# Patient Record
Sex: Female | Born: 1960 | Race: White | Hispanic: No
Health system: Southern US, Community
[De-identification: ages and names within clinical notes are randomized; demographics above are authoritative.]

---

## 2001-04-16 ENCOUNTER — Ambulatory Visit (HOSPITAL_COMMUNITY): Admission: RE | Admit: 2001-04-16 | Discharge: 2001-04-16 | Payer: Self-pay | Admitting: Specialist

## 2001-04-16 ENCOUNTER — Encounter: Payer: Self-pay | Admitting: Specialist

## 2001-05-24 ENCOUNTER — Other Ambulatory Visit: Admission: RE | Admit: 2001-05-24 | Discharge: 2001-05-24 | Payer: Self-pay | Admitting: Obstetrics and Gynecology

## 2002-07-12 ENCOUNTER — Ambulatory Visit (HOSPITAL_COMMUNITY): Admission: RE | Admit: 2002-07-12 | Discharge: 2002-07-12 | Payer: Self-pay | Admitting: Obstetrics and Gynecology

## 2002-07-12 ENCOUNTER — Encounter: Payer: Self-pay | Admitting: Obstetrics and Gynecology

## 2003-09-03 ENCOUNTER — Ambulatory Visit (HOSPITAL_COMMUNITY): Admission: RE | Admit: 2003-09-03 | Discharge: 2003-09-03 | Payer: Self-pay | Admitting: Specialist

## 2003-10-08 ENCOUNTER — Emergency Department (HOSPITAL_COMMUNITY): Admission: EM | Admit: 2003-10-08 | Discharge: 2003-10-09 | Payer: Self-pay | Admitting: *Deleted

## 2003-10-09 ENCOUNTER — Inpatient Hospital Stay (HOSPITAL_COMMUNITY): Admission: EM | Admit: 2003-10-09 | Discharge: 2003-10-13 | Payer: Self-pay | Admitting: Psychiatry

## 2003-11-18 ENCOUNTER — Inpatient Hospital Stay (HOSPITAL_COMMUNITY): Admission: RE | Admit: 2003-11-18 | Discharge: 2003-11-22 | Payer: Self-pay | Admitting: Psychiatry

## 2004-09-12 IMAGING — CT CT HEAD W/O CM
1 series · 16 of 28 positions shown, 20 images · non-contrast
Comparison: none

CLINICAL DATA: Drug overdose.
 CT HEAD WITHOUT CONTRAST 
 No prior studies for comparison.

  Routine non-contrast head CT was performed. 
 There is no evidence of intracranial hemorrhage, brain edema, or mass effect. The ventricles are normal. No extra-axial abnormalities are identified. Bone windows show no significant abnormalities.
 IMPRESSION
 Negative non-contrast head CT.

[Series 8155: — · axial · 0.45mm/px · z∈[-655,-530]mm · 16 of 28 slices shown, 20 images]
[im 2/28  brain]
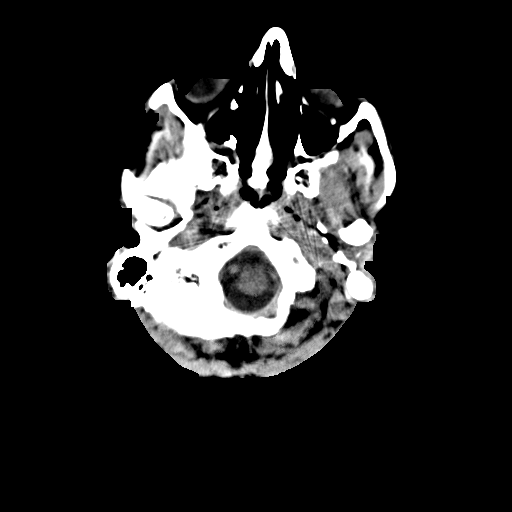
[im 2/28  bone]
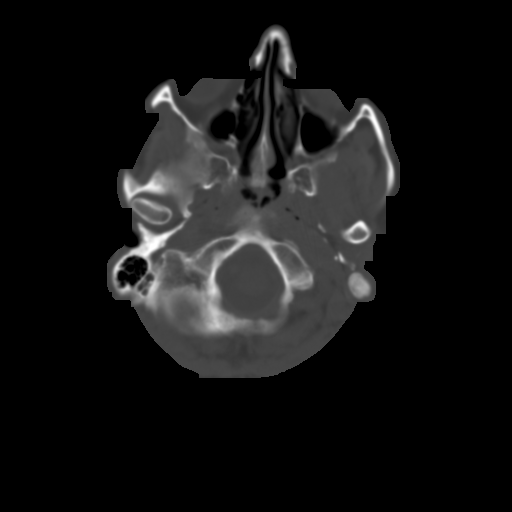
[im 4/28  brain]
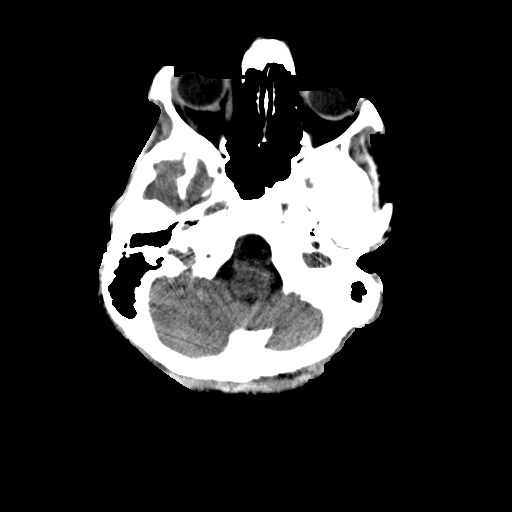
[im 6/28  brain]
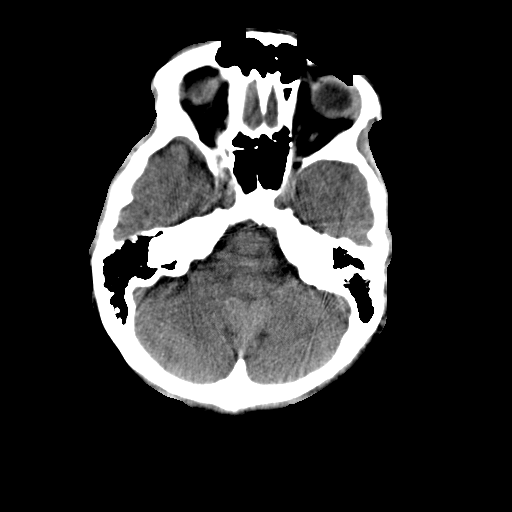
[im 7/28  brain]
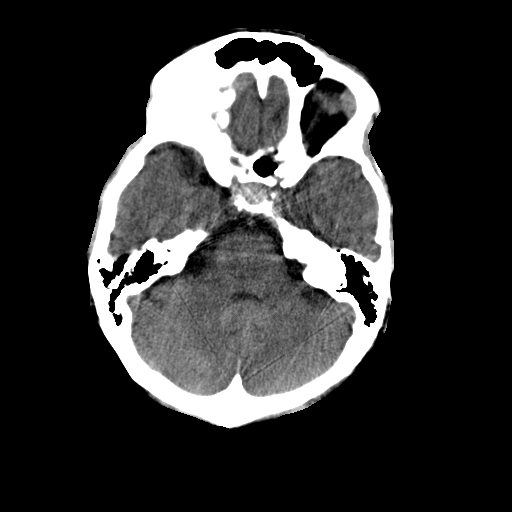
[im 9/28  brain]
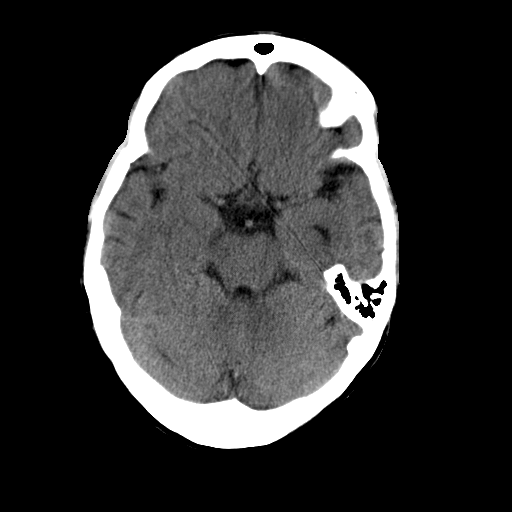
[im 9/28  bone]
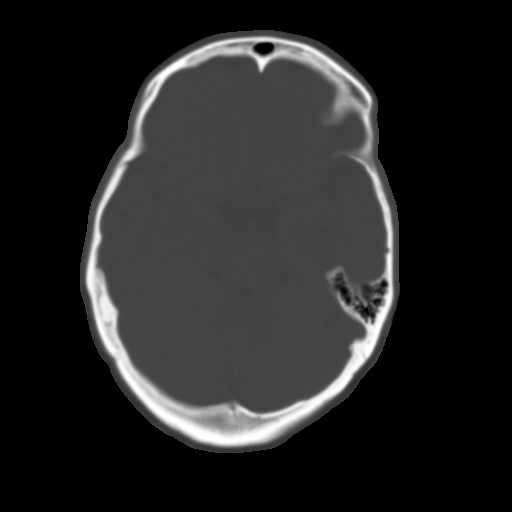
[im 10/28  brain]
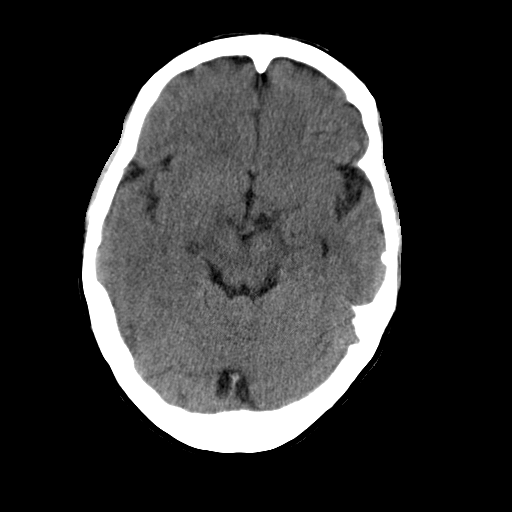
[im 12/28  brain]
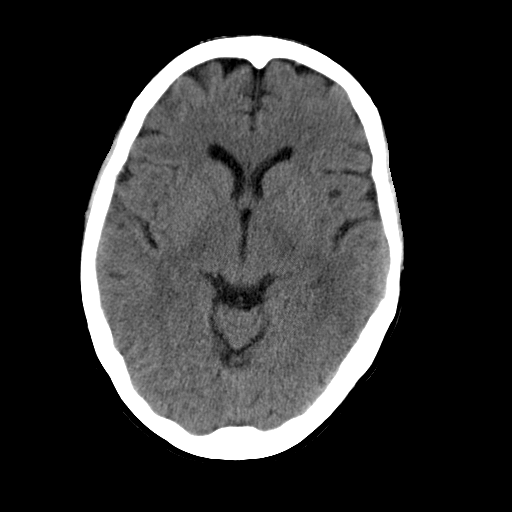
[im 14/28  brain]
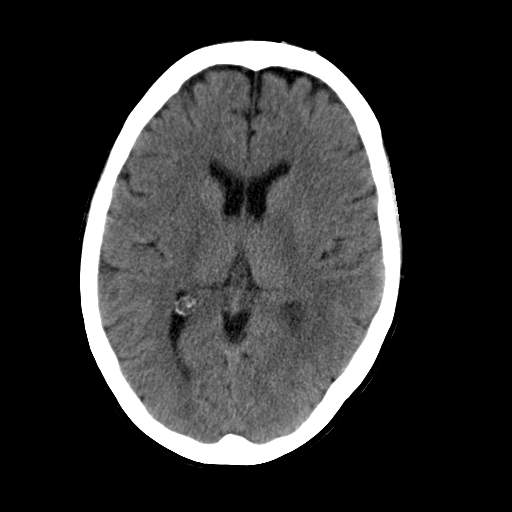
[im 15/28  brain]
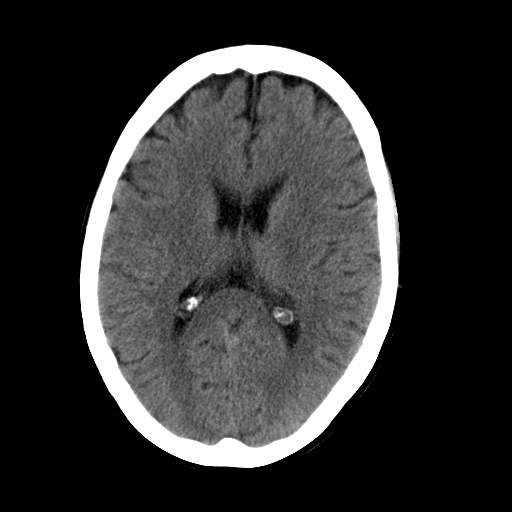
[im 15/28  bone]
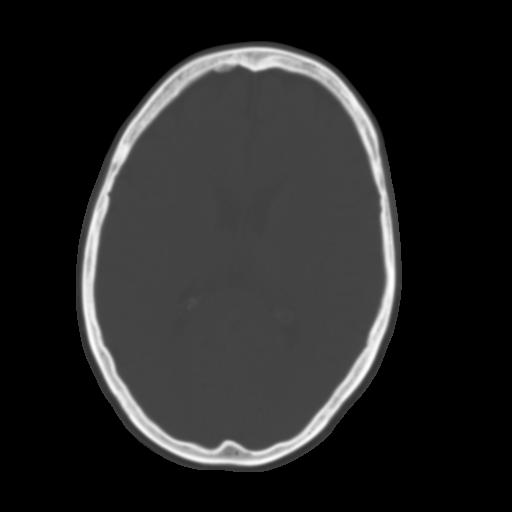
[im 17/28  brain]
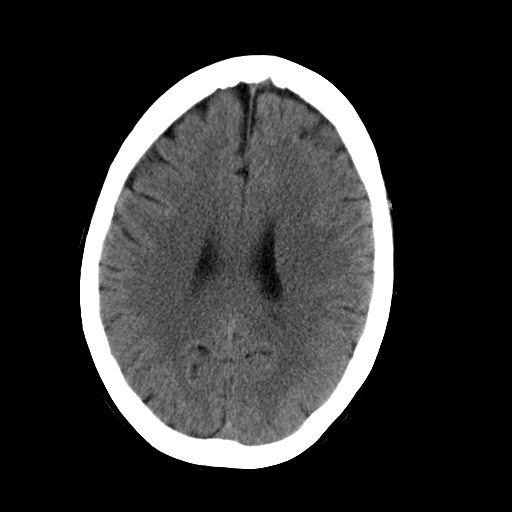
[im 19/28  brain]
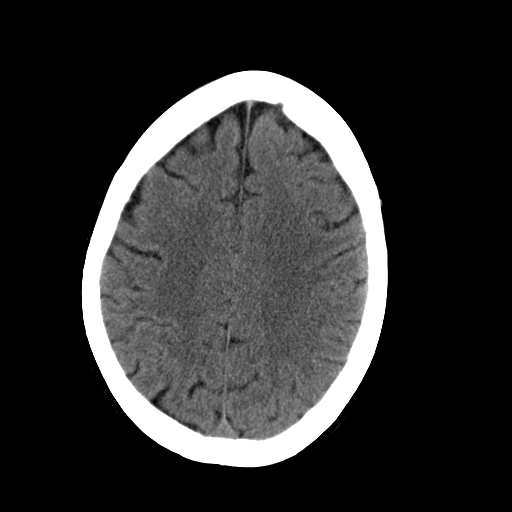
[im 20/28  brain]
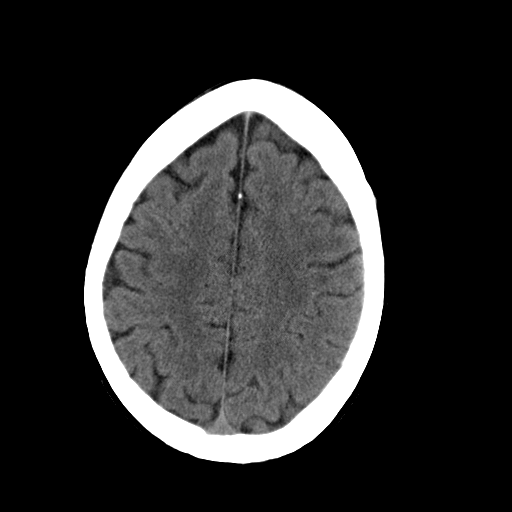
[im 22/28  brain]
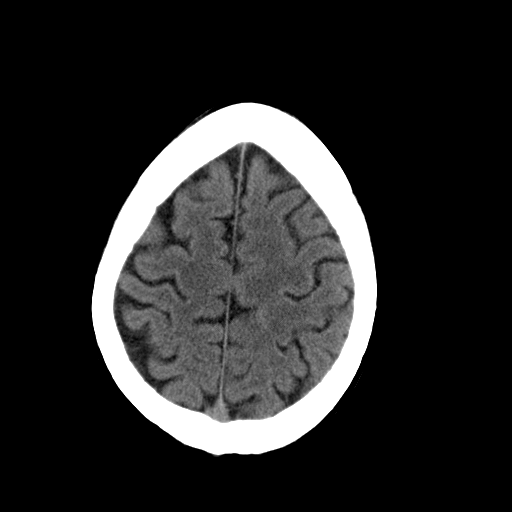
[im 22/28  bone]
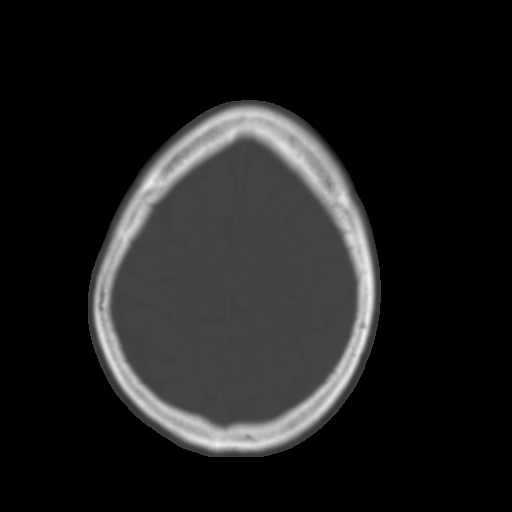
[im 23/28  brain]
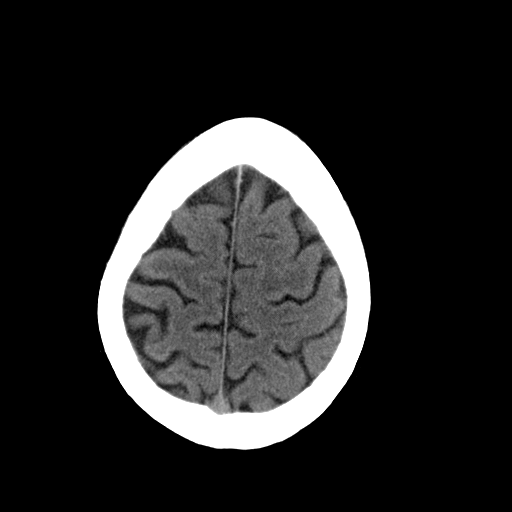
[im 25/28  brain]
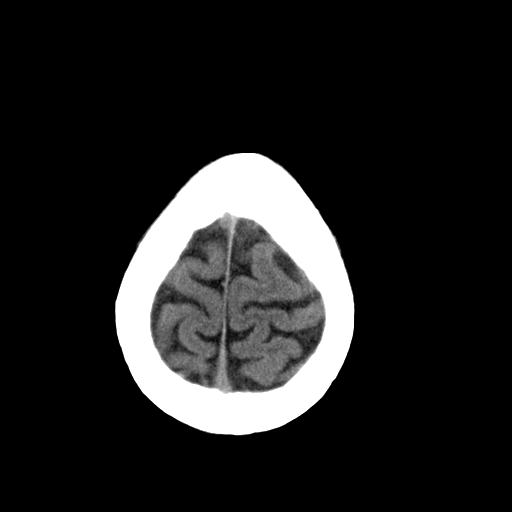
[im 27/28  brain]
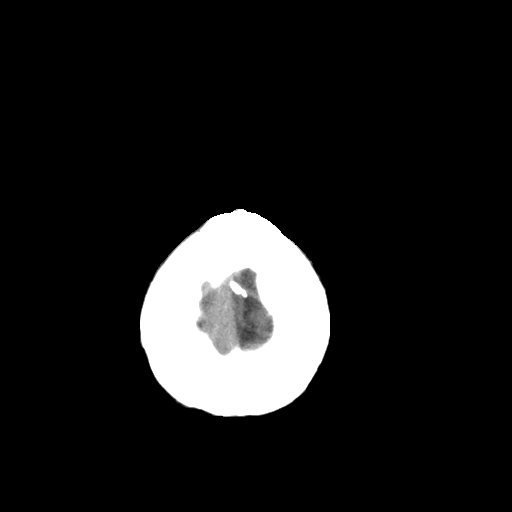

[16 of 28 positions shown; findings below may reference images not displayed]

## 2005-04-18 ENCOUNTER — Ambulatory Visit (HOSPITAL_COMMUNITY): Admission: RE | Admit: 2005-04-18 | Discharge: 2005-04-18 | Payer: Self-pay | Admitting: Obstetrics & Gynecology

## 2006-05-04 ENCOUNTER — Ambulatory Visit (HOSPITAL_COMMUNITY): Admission: RE | Admit: 2006-05-04 | Discharge: 2006-05-04 | Payer: Self-pay | Admitting: Family Medicine

## 2006-05-18 ENCOUNTER — Encounter: Admission: RE | Admit: 2006-05-18 | Discharge: 2006-05-18 | Payer: Self-pay | Admitting: Advanced Practice Midwife

## 2007-11-23 ENCOUNTER — Ambulatory Visit (HOSPITAL_COMMUNITY): Admission: RE | Admit: 2007-11-23 | Discharge: 2007-11-23 | Payer: Self-pay | Admitting: Obstetrics and Gynecology

## 2007-12-28 ENCOUNTER — Other Ambulatory Visit: Admission: RE | Admit: 2007-12-28 | Discharge: 2007-12-28 | Payer: Self-pay | Admitting: Obstetrics and Gynecology

## 2009-02-24 ENCOUNTER — Ambulatory Visit (HOSPITAL_COMMUNITY): Admission: RE | Admit: 2009-02-24 | Discharge: 2009-02-24 | Payer: Self-pay | Admitting: Obstetrics & Gynecology

## 2009-03-26 ENCOUNTER — Other Ambulatory Visit: Admission: RE | Admit: 2009-03-26 | Discharge: 2009-03-26 | Payer: Self-pay | Admitting: Obstetrics and Gynecology

## 2010-05-02 ENCOUNTER — Encounter: Payer: Self-pay | Admitting: Advanced Practice Midwife

## 2010-05-02 ENCOUNTER — Encounter: Payer: Self-pay | Admitting: Specialist

## 2010-05-03 ENCOUNTER — Encounter: Payer: Self-pay | Admitting: Obstetrics and Gynecology

## 2010-08-27 NOTE — Discharge Summary (Signed)
NAMEMAIYA, KATES                      ACCOUNT NO.:  [id number]   MEDICAL RECORD NO.:  [id number]                   PATIENT TYPE:  IPS   LOCATION:  0502                                 [facility]ILITY[facility]   PHYSICIAN:  Jeanice Lim, M.D.              DATE OF BIRTH:  Jul 22, 1960   DATE OF ADMISSION:  11/18/2003  DATE OF DISCHARGE:  11/22/2003                                 DISCHARGE SUMMARY   IDENTIFYING DATA:  This is a 50 year old Caucasian female, divorced,  voluntarily admitted.  Did well for about a week after discharge and then  Cymbalta was increased to 60 mg and began having increased depressed mood,  getting sad, desperate, more reclusive. Went to a party and had a good time  but then cried all the way home.  Feels mood is more unstable.  Thoughts of  suicide for the past week and a half.  No auditory or visual hallucinations.   PAST PSYCHIATRIC HISTORY:  Positive suicidal ideation with thoughts of  overdosing.  Second [facility] admission.  Last discharge July  4th.  Prescribed by primary care physician.  Discharged on Ambien and  Cymbalta 30 mg on July 4th.  Questionable mood lability at the time of  admission.   MEDICATIONS:  Cymbalta 60 mg (she had been on for three weeks), Gynodiol for  uterine bleeding, Ambien p.r.n. insomnia.   ALLERGIES:  Possibly to DEPO PROVERA, which worsened mood.   PHYSICAL EXAMINATION:  Physical and neurological exam essentially within  normal limits.   LABORATORY DATA:  Routine admission labs within normal limits.   MENTAL STATUS EXAM:  Fully alert, pleasant and cooperative with restricted  affect.  Speech soft tone, decreased amount.  Mood depressed.  Affect  restricted.  Thought processes positive suicidal ideation.  Questionable  intrusive thoughts.  Cognitively intact.  Judgment and insight adequate.   ADMISSION DIAGNOSES:   AXIS I:  1.  Rule out bipolar disorder, type 2, versus SRNI induced mood instability.  2.  History of depression.   AXIS II:  Deferred.   AXIS III:  None.   AXIS IV:  Severe (stress related to economic stress, occupational stress and  limited support system).   AXIS V:  29/60.   HOSPITAL COURSE:  The patient was admitted and ordered routine p.r.n.  medications and underwent further monitoring.  Was encouraged to participate  in individual, group and milieu therapy.  Was decreased on Cymbalta due to  worsening and mood lability after the increase on outpatient basis.  The  patient was considered for a mood stabilizer and further monitoring.  Routine reversible medical etiologies ruled out.  The patient reported  feeling less shaky on 30 mg.  Sleep was improving.  There was a significant  decrease in suicidal ideation since patient was hospitalized.  The patient  reported decreased Xeloda and Cymbalta seemed to make a huge difference and  she no longer felt  out of control and dangerous.  Felt her mood was more  stable and predictable.  Clearly less mood swings.  The patient reported  motivation to be followed up by therapist and to obtain a psychiatrist for  outpatient management.  The patient was continued to be observed and showed  improvement in mood, was appropriate on the unit with no dangerous ideation.  Affect became brighter.  Sleeping well, eating well.  No side effects to  medications.   CONDITION ON DISCHARGE:  The patient was discharged with a positive response  to clinical intervention.  Mood was more euthymic.  Affect brighter.  No  signs of mood swings.  No anxiety or agitation.  No psychotic symptoms.  No  risk issues.  The patient had improved judgment and insight and coping  skills.  The patient was given medication education.   DISCHARGE MEDICATIONS:  1.  Cymbalta 30 mg q.d.  2.  Ambien 10 mg q.h.s.   FOLLOW UP:  The patient was to follow up with Dr. __________, a therapist in  [place], and patient was to follow up at [facility]  on Wednesday, August 17th at 9 a.m. for medication management and therapy.   DISCHARGE DIAGNOSES:   AXIS I:  1.  Rule out bipolar disorder, type 2, versus SRNI induced mood instability.  2.  History of depression.   AXIS II:  Deferred.   AXIS III:  None.   AXIS IV:  Severe (stress related to economic stress, occupational stress and  limited support system).   AXIS V:  Global Assessment of Functioning on discharge 55.                                               Jeanice Lim, M.D.    JEM/MEDQ  D:  12/21/2003  T:  12/22/2003  Job:  [id number]

## 2010-08-27 NOTE — Discharge Summary (Signed)
NAME:  Kristine Lopez, Kristine Lopez                      ACCOUNT NO.:  [id number]   MEDICAL RECORD NO.:  [id number]                   PATIENT TYPE:  IPS   LOCATION:  0502                                 [facility]ILITY:  [facility]   PHYSICIAN:  Geoffery Lyons, M.D.                   DATE OF BIRTH:  11/14/1960   DATE OF ADMISSION:  10/09/2003  DATE OF DISCHARGE:  10/13/2003                                 DISCHARGE SUMMARY   CHIEF COMPLAINT AND PRESENT ILLNESS:  This was the first admission to [facility] for this 50 year old single white female  voluntarily admitted.  She had a history of overdose on Ambien.  She did  not want to be here, at home feeling sorry for herself, everything she  touches didn't work.  She talked to a friend, suggested to call EMS.  She  was recently fired, money problems, feeling hopeless, sleeping had  decreased.  She cut self, carved 'loser' on her leg.   PAST PSYCHIATRIC HISTORY:  This was the first time at [facility]; no other psychiatric treatment.  She sees Dr. Valaria Good, a counselor;  started back recently.   SUBSTANCE ABUSE HISTORY:  She denied the use or abuse of any substances.   PAST MEDICAL HISTORY:  Noncontributory.   MEDICATIONS:  1. Lexapro 10 mg for two and a half weeks, to switch to Cymbalta.  2. Klonopin 1 mg twice a day as needed.  3. Ambien 10 mg at bedtime for sleep.   PHYSICAL EXAMINATION:  Physical examination was performed, failed to show  any acute findings.   LABORATORY DATA:  TSH 2.380.  CBC was within normal limits.  Drug screen:  Negative for substances of abuse.  Liver profile was within normal limits.   MENTAL STATUS EXAM:  Mental status exam revealed an alert, oriented,  cooperative female, fair eye contact.  Speech was clear; normal rate, tempo,  and production.  Mood: Depressed.  Affect: Depressed.  Thought processes:  Logical, coherent, and relevant; dealt with the events, the sense of  hopelessness  and helplessness, feeling ashamed, issues of very poor self-  esteem, no delusions, no hallucinations.  Cognitive: Cognition was well  preserved.   ADMISSION DIAGNOSES:   AXIS I:  Major depression, recurrent.   AXIS II:  No diagnosis.   AXIS III:  No diagnosis.   AXIS IV:  Moderate.   AXIS V:  Global assessment of functioning upon admission 25, highest global  assessment of functioning in the last year 65.   HOSPITAL COURSE:  She was admitted and started in intensive individual and  group psychotherapy.  She was switched to the Cymbalta 30 mg per day.  She  was given Ambien for sleep.  She endorsed that she was feeling very  depressed, a lot of stress, lost her job, very poor self-image, issues of  hopelessness and helplessness.  She endorsed she  was always very hard on  herself, although her 49 year old child stated she was a good mother she  could not validate that.  Losing her job was her last straw.  But as she got  onto the unit and she started talking about things, she started feeling  better.  She claimed that she had decided to move on.  There was a family  session.  She was tearful.  She endorsed no suicidal ideas but not engaged,  mostly to herself.  Her son reported she was not stable enough to be home  alone during the weekend so we kept her in the hospital for safety reasons.  She felt that the Cymbalta was starting to help.  Mood had improved, affect  was brighter, sleep was better.  On July 4, she was in full contact with  reality, denied any suicidal or homicidal ideation, no hallucinations, no  delusions, increased insight in terms of what she needed to do to take care  of herself.  She would check with a lawyer to she if she was wrongfully  terminated.  She planned to apply for unemployment benefits and eventually  get herself another job.  She was willing to come to pursue further  outpatient treatment through the [facility].   DISCHARGE  DIAGNOSES:   AXIS I:  Major depression, recurrent.   AXIS II:  No diagnosis.   AXIS III:  No diagnosis.   AXIS IV:  Moderate.   AXIS V:  Global assessment of functioning upon discharge 55.   DISCHARGE MEDICATIONS:  1. Cymbalta 30 mg for two more days, then plan to increase it to 60 mg.  2. Ambien 10 mg at bedtime for sleep.   FOLLOW UP:  She was to follow up at [facility].                                               Geoffery Lyons, M.D.    IL/MEDQ  D:  11/04/2003  T:  11/05/2003  Job:  [id number]

## 2019-02-14 ENCOUNTER — Other Ambulatory Visit: Payer: Self-pay

## 2019-02-14 DIAGNOSIS — Z20822 Contact with and (suspected) exposure to covid-19: Secondary | ICD-10-CM

## 2019-02-15 LAB — NOVEL CORONAVIRUS, NAA: SARS-CoV-2, NAA: NOT DETECTED

## 2019-02-16 ENCOUNTER — Telehealth: Payer: Self-pay

## 2019-02-16 NOTE — Telephone Encounter (Signed)
Patient called in requesting [place] lab results and MyChart setup assistance - DOB/Address verification  - Negative results given. Assisted with MyChart setup, no further questions.
